# Patient Record
Sex: Female | Born: 1965 | Race: White | Hispanic: No | Marital: Married | Smoking: Never smoker
Health system: Southern US, Community
[De-identification: ages and names within clinical notes are randomized; demographics above are authoritative.]

## PROBLEM LIST (undated history)

## (undated) HISTORY — PX: BREAST BIOPSY: SHX20

---

## 2013-10-02 ENCOUNTER — Emergency Department (HOSPITAL_COMMUNITY)
Admission: EM | Admit: 2013-10-02 | Discharge: 2013-10-03 | Disposition: A | Discharge status: Home / Self Care | Payer: BC Managed Care – PPO | Attending: Emergency Medicine | Admitting: Emergency Medicine

## 2013-10-02 ENCOUNTER — Encounter (HOSPITAL_COMMUNITY): Payer: Self-pay | Admitting: Emergency Medicine

## 2013-10-02 DIAGNOSIS — W5911XA Bitten by nonvenomous snake, initial encounter: Secondary | ICD-10-CM | POA: Insufficient documentation

## 2013-10-02 DIAGNOSIS — Y9389 Activity, other specified: Secondary | ICD-10-CM | POA: Diagnosis not present

## 2013-10-02 DIAGNOSIS — W5901XA Bitten by nonvenomous lizards, initial encounter: Secondary | ICD-10-CM | POA: Insufficient documentation

## 2013-10-02 DIAGNOSIS — T63001A Toxic effect of unspecified snake venom, accidental (unintentional), initial encounter: Secondary | ICD-10-CM

## 2013-10-02 DIAGNOSIS — S61209A Unspecified open wound of unspecified finger without damage to nail, initial encounter: Secondary | ICD-10-CM | POA: Diagnosis not present

## 2013-10-02 DIAGNOSIS — Y929 Unspecified place or not applicable: Secondary | ICD-10-CM | POA: Diagnosis not present

## 2013-10-02 DIAGNOSIS — Z23 Encounter for immunization: Secondary | ICD-10-CM | POA: Insufficient documentation

## 2013-10-02 LAB — CBC WITH DIFFERENTIAL/PLATELET
Basophils Absolute: 0.1 10*3/uL (ref 0.0–0.1)
Basophils Relative: 1 % (ref 0–1)
EOS ABS: 0.1 10*3/uL (ref 0.0–0.7)
Eosinophils Relative: 1 % (ref 0–5)
HCT: 39.8 % (ref 36.0–46.0)
HEMOGLOBIN: 13.4 g/dL (ref 12.0–15.0)
LYMPHS ABS: 1.8 10*3/uL (ref 0.7–4.0)
Lymphocytes Relative: 28 % (ref 12–46)
MCH: 30.3 pg (ref 26.0–34.0)
MCHC: 33.7 g/dL (ref 30.0–36.0)
MCV: 90 fL (ref 78.0–100.0)
MONOS PCT: 5 % (ref 3–12)
Monocytes Absolute: 0.4 10*3/uL (ref 0.1–1.0)
Neutro Abs: 4.4 10*3/uL (ref 1.7–7.7)
Neutrophils Relative %: 65 % (ref 43–77)
Platelets: 202 10*3/uL (ref 150–400)
RBC: 4.42 MIL/uL (ref 3.87–5.11)
RDW: 12.4 % (ref 11.5–15.5)
WBC: 6.6 10*3/uL (ref 4.0–10.5)

## 2013-10-02 MED ORDER — HYDROCODONE-ACETAMINOPHEN 5-325 MG PO TABS
1.0000 | ORAL_TABLET | Freq: Once | ORAL | Status: AC
  Administered 2013-10-02: 1 via ORAL
  Filled 2013-10-02: qty 1

## 2013-10-02 MED ORDER — TETANUS-DIPHTH-ACELL PERTUSSIS 5-2.5-18.5 LF-MCG/0.5 IM SUSP
0.5000 mL | Freq: Once | INTRAMUSCULAR | Status: AC
  Administered 2013-10-03: 0.5 mL via INTRAMUSCULAR
  Filled 2013-10-02: qty 0.5

## 2013-10-02 NOTE — ED Provider Notes (Signed)
CSN: [id number]     Arrival date & time 10/02/13  2113 History   First MD Initiated Contact with Patient 10/02/13 2301     Chief Complaint  Patient presents with  . Snake Bite     (Consider location/radiation/quality/duration/timing/severity/associated sxs/prior Treatment) HPI Patient states she was trimming a bush around 7:30 this evening and felt a sharp pain to her right thumb. She notes to finger marks. She had immediate swelling and redness to the area. Just had considerable pain especially with palpation and movement of the right thumb. She denies any nausea or vomiting. No fevers or chills. Since that time the redness and swelling have decreased. She continues to have pain at the area of the finger marks into the thenar eminence. At no time did she see the animal. History reviewed. No pertinent past medical history. History reviewed. No pertinent past surgical history. No family history on file. History  Substance Use Topics  . Smoking status: Never Smoker   . Smokeless tobacco: Not on file  . Alcohol Use: Yes     Comment: soc.   OB History   Grav Para Term Preterm Abortions TAB SAB Ect Mult Living            0     Review of Systems  Constitutional: Negative for fever and chills.  Respiratory: Negative for shortness of breath.   Cardiovascular: Negative for chest pain.  Gastrointestinal: Negative for nausea, vomiting and abdominal pain.  Skin: Positive for color change and wound.  Neurological: Negative for dizziness and weakness.  All other systems reviewed and are negative.     Allergies  Review of patient's allergies indicates no known allergies.  Home Medications   Prior to Admission medications   Medication Sig Start Date End Date Taking? Authorizing Provider  acetaminophen (TYLENOL) 500 MG tablet Take 500 mg by mouth every 6 (six) hours as needed for mild pain.   Yes Historical Provider, MD   BP 112/44  Pulse 61  Temp(Src) 99.1 F (37.3 C) (Oral)  Resp  16  Ht  (1.651 m)  Wt 125 lb (56.7 kg)  BMI 20.80 kg/m2  SpO2 100%  LMP 09/28/2013 Physical Exam  Nursing note and vitals reviewed. Constitutional: She is oriented to person, place, and time. She appears well-developed and well-nourished. No distress.  HENT:  Head: Normocephalic and atraumatic.  Mouth/Throat: Oropharynx is clear and moist.  Eyes: EOM are normal. Pupils are equal, round, and reactive to light.  Neck: Normal range of motion. Neck supple.  Cardiovascular: Normal rate and regular rhythm.   Pulmonary/Chest: Effort normal and breath sounds normal. No respiratory distress. She has no wheezes. She has no rales.  Abdominal: Soft. Bowel sounds are normal. She exhibits no distension and no mass. There is no tenderness. There is no rebound and no guarding.  Musculoskeletal: Normal range of motion. She exhibits no edema and no tenderness.  Appears to be 2 small puncture wounds to the dorsal base of the right thumb. There is tenderness to palpation in the surrounding area and thenar eminence. There is pain with range of motion of the right thumb. I do not appreciate any erythema or swelling. Mildly decreased Refill.   Neurological: She is alert and oriented to person, place, and time.  Good grip strength bilaterally. Patient has mildly decreased sensation to the tips of the right fingers compared to left.  Skin: Skin is warm and dry. No rash noted. No erythema.  Psychiatric: She has a normal mood  and affect. Her behavior is normal.    ED Course  Procedures (including critical care time) Labs Review Labs Reviewed  CBC WITH DIFFERENTIAL  COMPREHENSIVE METABOLIC PANEL  PROTIME-INR  APTT  FIBRINOGEN    Imaging Review No results found.   EKG Interpretation None      MDM   Final diagnoses:  None    Discussed with poison control. Recommended observation for several hours. No recommendations for [employer]. Tetanus updated and pain medication given.  Patient observed  in emergency department for 4 hours. Her pain is improved. There is no redness or swelling. Will be discharged home with strict return precautions.  Loren Racer, MD 10/03/13 [phone]

## 2013-10-02 NOTE — ED Notes (Signed)
PT's hand is mildly swollen; no reddness noted; pt reports stiffness in thumb joint and shooting pain approximately every 10 mins. Small puncture mark visible at this time. Pt presents with picture 30 mins post bite. Hand had 2 visible bites and reddened. Pt has been icing. Symptoms improving; no other present symptoms.

## 2013-10-02 NOTE — ED Notes (Signed)
MD at bedside. 

## 2013-10-02 NOTE — ED Notes (Signed)
Pt states she was gardening at 730 pm tonight. States she reached into bush, and felt "a knife" go through left hand. Pt has two bite marks, appears to be bitten by snake. Pt has redness and welts to area and is reporting numbness to left thumb. Denies SOB. Pt AO x4. NAD.

## 2013-10-03 LAB — FIBRINOGEN: Fibrinogen: 247 mg/dL (ref 204–475)

## 2013-10-03 LAB — COMPREHENSIVE METABOLIC PANEL
ALK PHOS: 55 U/L (ref 39–117)
ALT: 25 U/L (ref 0–35)
AST: 25 U/L (ref 0–37)
Albumin: 3.8 g/dL (ref 3.5–5.2)
Anion gap: 13 (ref 5–15)
BUN: 23 mg/dL (ref 6–23)
CO2: 27 meq/L (ref 19–32)
Calcium: 9.2 mg/dL (ref 8.4–10.5)
Chloride: 103 mEq/L (ref 96–112)
Creatinine, Ser: 1.03 mg/dL (ref 0.50–1.10)
GFR calc non Af Amer: 63 mL/min — ABNORMAL LOW (ref >90)
GFR, EST AFRICAN AMERICAN: 73 mL/min — AB (ref >90)
Glucose, Bld: 104 mg/dL — ABNORMAL HIGH (ref 70–99)
POTASSIUM: 4.2 meq/L (ref 3.7–5.3)
SODIUM: 143 meq/L (ref 137–147)
TOTAL PROTEIN: 7 g/dL (ref 6.0–8.3)
Total Bilirubin: 1 mg/dL (ref 0.3–1.2)

## 2013-10-03 LAB — PROTIME-INR
INR: 1.14 (ref 0.00–1.49)
Prothrombin Time: 14.6 seconds (ref 11.6–15.2)

## 2013-10-03 LAB — APTT: aPTT: 34 seconds (ref 24–37)

## 2013-10-03 MED ORDER — HYDROCODONE-ACETAMINOPHEN 5-325 MG PO TABS: 1.0000 | ORAL_TABLET | ORAL | Status: AC | PRN

## 2013-10-03 NOTE — Discharge Instructions (Signed)
Snake Bite °Snakes may be either venomous (containing poison) or nonvenomous (nonpoisonous). A nonvenomous snake bite will cause trauma or a wound to the skin and possibly the deeper tissues. A venomous snake will also cause a traumatic wound, but more importantly, it may have injected venom into the wound. Snake bite venom can be extremely serious and even deadly. One type of venom may cause major skin, tissue and muscle damage, and failure of normal blood clotting. This may cause extreme swelling and pain of the affected area. Another type of venom can affect the brain and nervous system and may cause death. The treatment for venomous snake bite may require the use of antivenom medicine. If you are unsure if your bite is from a venomous snake, you MUST seek immediate medical attention. °YOU MIGHT NEED A TETANUS SHOT NOW IF: °· You have no idea when you had the last one. °· You have never had a tetanus shot before. °· The bite broke your skin. °If you need a tetanus shot, and you decide not to get one, there is a rare chance of getting tetanus. Sickness from tetanus can be serious. °HOME CARE INSTRUCTIONS  °A snake bit you and caused a skin wound. It may or may not have been venomous. If the snake was venomous, a small amount of venom may have been injected into your skin. °· Keep the bite area clean and dry. °· Keep the extremity elevated above the level of the heart for the next 48 hours. °· Wash the bite area 3 times daily with soap and water or an antiseptic. Apply an adhesive or gauze bandage to the bite area. °· If you develop blistering of any type at the site of the bite, protect the blisters from breaking. Do not attempt to open it. °· If you were given a tetanus shot, your arm may get swollen, red and warm at the shot site. This is a common response to the injection. °SEEK IMMEDIATE MEDICAL CARE IF:  °· You develop symptoms of poisoning including increased pain, redness, swelling, blood blisters or purple  spots in the bite area, nausea, vomiting, numbness, tingling, excessive sweating, breathing difficulty, blurred vision, feelings of lightheadedness, or feeling faint. If you develop symptoms of poisoning, you MUST seek immediate medical attention. °· The bite becomes infected. Symptoms may include redness, swelling, pain, tenderness, pus, red streaks running from the wound, or an oral temperature above 102° F (38.9° C), not controlled by medicine. °· Your condition or wound becomes worse. °MAKE SURE YOU:  °· Understand these instructions. °· Will watch your condition. °· Will get help right away if you are not doing well or get worse. °Document Released: 12/31/1999 Document Revised: 03/27/2011 Document Reviewed: 05/26/2009 °[employer]® Patient Information ©2015 [employer]. This information is not intended to replace advice given to you by your health care provider. Make sure you discuss any questions you have with your health care provider. ° °

## 2013-10-03 NOTE — ED Notes (Signed)
Dr Yelverton at bedside.  

## 2014-07-21 ENCOUNTER — Other Ambulatory Visit: Payer: Self-pay

## 2014-07-21 DIAGNOSIS — Z1231 Encounter for screening mammogram for malignant neoplasm of breast: Secondary | ICD-10-CM

## 2014-08-19 ENCOUNTER — Ambulatory Visit
Admission: RE | Admit: 2014-08-19 | Discharge: 2014-08-19 | Disposition: A | Discharge status: Home / Self Care | Payer: 59 | Source: Ambulatory Visit

## 2014-08-19 DIAGNOSIS — Z1231 Encounter for screening mammogram for malignant neoplasm of breast: Secondary | ICD-10-CM

## 2015-08-26 ENCOUNTER — Ambulatory Visit (HOSPITAL_COMMUNITY)
Admission: EM | Admit: 2015-08-26 | Discharge: 2015-08-26 | Disposition: A | Discharge status: Home / Self Care | Payer: 59 | Attending: Family Medicine | Admitting: Family Medicine

## 2015-08-26 ENCOUNTER — Encounter (HOSPITAL_COMMUNITY): Payer: Self-pay | Admitting: Emergency Medicine

## 2015-08-26 DIAGNOSIS — M545 Low back pain, unspecified: Secondary | ICD-10-CM

## 2015-08-26 MED ORDER — KETOROLAC TROMETHAMINE 60 MG/2ML IM SOLN
60.0000 mg | Freq: Once | INTRAMUSCULAR | Status: AC
  Administered 2015-08-26: 60 mg via INTRAMUSCULAR

## 2015-08-26 MED ORDER — NAPROXEN 500 MG PO TABS: 500.0000 mg | ORAL_TABLET | Freq: Two times a day (BID) | ORAL | 0 refills | Status: AC

## 2015-08-26 MED ORDER — KETOROLAC TROMETHAMINE 60 MG/2ML IM SOLN
INTRAMUSCULAR | Status: AC
  Filled 2015-08-26: qty 2

## 2015-08-26 MED ORDER — CYCLOBENZAPRINE HCL 10 MG PO TABS: 10.0000 mg | ORAL_TABLET | Freq: Two times a day (BID) | ORAL | 0 refills | Status: AC | PRN

## 2015-08-26 NOTE — ED Triage Notes (Signed)
Pt c/o lower back pain onset this am after sneezing at work.   Reports hx of ruptured disc... Pain increases w/activity  Brought back in wheel chair.... A&O x4... NAD

## 2015-08-26 NOTE — ED Provider Notes (Signed)
CSN: [id number]     Arrival date & time 08/26/15  1130 History   None    Chief Complaint  Patient presents with  . Back Pain   (Consider location/radiation/quality/duration/timing/severity/associated sxs/prior Treatment) Patient states she was getting out of shower this morning and felt pain in her right back as she was drying off.  She was at work and coughed and felt a sharp pain or pulling in her right lumbar muscle and has been bent over and having difficulty standing up straight because of the pain in her back.     The history is provided by the patient.  Back Pain  Location:  Lumbar spine Quality:  Aching, cramping and stabbing Radiates to:  Does not radiate Pain severity:  Moderate Onset quality:  Sudden Duration:  3 hours Timing:  Constant Progression:  Waxing and waning Chronicity:  New Context: physical stress and twisting   Relieved by:  None tried Worsened by:  Nothing   History reviewed. No pertinent past medical history. History reviewed. No pertinent surgical history. History reviewed. No pertinent family history. Social History  Substance Use Topics  . Smoking status: Never Smoker  . Smokeless tobacco: Never Used  . Alcohol use Yes     Comment: soc.   OB History    Gravida Para Term Preterm AB Living             0   SAB TAB Ectopic Multiple Live Births                 Review of Systems  Constitutional: Negative.   HENT: Negative.   Eyes: Negative.   Respiratory: Negative.   Cardiovascular: Negative.   Gastrointestinal: Negative.   Endocrine: Negative.   Genitourinary: Negative.   Musculoskeletal: Positive for back pain.  Skin: Negative.   Allergic/Immunologic: Negative.   Neurological: Negative.   Hematological: Negative.   Psychiatric/Behavioral: Negative.     Allergies  Review of patient's allergies indicates no known allergies.  Home Medications   Prior to Admission medications   Medication Sig Start Date End Date Taking?  Authorizing Provider  acetaminophen (TYLENOL) 500 MG tablet Take 500 mg by mouth every 6 (six) hours as needed for mild pain.    Historical Provider, MD  cyclobenzaprine (FLEXERIL) 10 MG tablet Take 1 tablet (10 mg total) by mouth 2 (two) times daily as needed for muscle spasms. 08/26/15   Deatra CanterWilliam J Elgin Carn, FNP  HYDROcodone-acetaminophen (NORCO) 5-325 MG per tablet Take 1 tablet by mouth every 4 (four) hours as needed. 10/03/13   Loren Raceravid Yelverton, MD  naproxen (NAPROSYN) 500 MG tablet Take 1 tablet (500 mg total) by mouth 2 (two) times daily with a meal. 08/26/15   Deatra CanterWilliam J Neysha Criado, FNP   Meds Ordered and Administered this Visit   Medications  ketorolac (TORADOL) injection 60 mg (not administered)    BP 113/68 (BP Location: Left Arm)   Pulse 62   Temp 98.6 F (37 C) (Oral)   Resp 16   LMP 08/05/2015   SpO2 99%  No data found.   Physical Exam  Constitutional: She appears well-developed and well-nourished.  HENT:  Head: Normocephalic and atraumatic.  Eyes: Conjunctivae are normal. Pupils are equal, round, and reactive to light.  Neck: Normal range of motion. Neck supple.  Cardiovascular: Normal rate, regular rhythm and normal heart sounds.   Pulmonary/Chest: Effort normal and breath sounds normal.  Abdominal: Soft. Bowel sounds are normal.  Musculoskeletal: She exhibits tenderness.  TTP right lumbar paraspinous muscles  and neg SLR bilateral  Nursing note and vitals reviewed.   Urgent Care Course   Clinical Course    Procedures (including critical care time)  Labs Review Labs Reviewed - No data to display  Imaging Review No results found.   Visual Acuity Review  Right Eye Distance:   Left Eye Distance:   Bilateral Distance:    Right Eye Near:   Left Eye Near:    Bilateral Near:         MDM   1. Right-sided low back pain without sciatica    Toradol  IM Naprosyn  one po bid x 10 days #20 Cyclobenzaprine 10 mg one po bid prn #20     Deatra Canter, FNP 08/26/15 1309

## 2015-10-27 ENCOUNTER — Other Ambulatory Visit: Payer: Self-pay | Admitting: Family Medicine

## 2015-10-27 DIAGNOSIS — Z1231 Encounter for screening mammogram for malignant neoplasm of breast: Secondary | ICD-10-CM

## 2016-04-05 DIAGNOSIS — M5126 Other intervertebral disc displacement, lumbar region: Secondary | ICD-10-CM | POA: Diagnosis not present

## 2016-04-05 DIAGNOSIS — M5416 Radiculopathy, lumbar region: Secondary | ICD-10-CM | POA: Diagnosis not present

## 2016-04-05 DIAGNOSIS — M5136 Other intervertebral disc degeneration, lumbar region: Secondary | ICD-10-CM | POA: Diagnosis not present

## 2016-05-03 DIAGNOSIS — N841 Polyp of cervix uteri: Secondary | ICD-10-CM | POA: Diagnosis not present

## 2016-05-04 DIAGNOSIS — Z6820 Body mass index (BMI) 20.0-20.9, adult: Secondary | ICD-10-CM | POA: Diagnosis not present

## 2016-05-04 DIAGNOSIS — Z01419 Encounter for gynecological examination (general) (routine) without abnormal findings: Secondary | ICD-10-CM | POA: Diagnosis not present

## 2016-08-25 ENCOUNTER — Other Ambulatory Visit: Payer: Self-pay | Admitting: Obstetrics and Gynecology

## 2016-08-25 DIAGNOSIS — Z1231 Encounter for screening mammogram for malignant neoplasm of breast: Secondary | ICD-10-CM

## 2016-09-05 ENCOUNTER — Ambulatory Visit
Admission: RE | Admit: 2016-09-05 | Discharge: 2016-09-05 | Disposition: A | Discharge status: Home / Self Care | Payer: 59 | Source: Ambulatory Visit | Attending: Obstetrics and Gynecology | Admitting: Obstetrics and Gynecology

## 2016-09-05 DIAGNOSIS — Z1231 Encounter for screening mammogram for malignant neoplasm of breast: Secondary | ICD-10-CM | POA: Diagnosis not present

## 2017-05-21 DIAGNOSIS — Z01419 Encounter for gynecological examination (general) (routine) without abnormal findings: Secondary | ICD-10-CM | POA: Diagnosis not present

## 2017-05-21 DIAGNOSIS — Z808 Family history of malignant neoplasm of other organs or systems: Secondary | ICD-10-CM | POA: Diagnosis not present

## 2017-05-21 DIAGNOSIS — Z800 Family history of malignant neoplasm of digestive organs: Secondary | ICD-10-CM | POA: Diagnosis not present

## 2017-05-21 DIAGNOSIS — Z8042 Family history of malignant neoplasm of prostate: Secondary | ICD-10-CM | POA: Diagnosis not present

## 2017-05-21 DIAGNOSIS — Z6820 Body mass index (BMI) 20.0-20.9, adult: Secondary | ICD-10-CM | POA: Diagnosis not present

## 2017-07-20 DIAGNOSIS — Z809 Family history of malignant neoplasm, unspecified: Secondary | ICD-10-CM | POA: Diagnosis not present

## 2019-06-04 IMAGING — MG 2D DIGITAL SCREENING BILATERAL MAMMOGRAM WITH CAD AND ADJUNCT TO
9 of 12 series · 9 of 28 positions shown · non-contrast
Comparison: Previous exam(s).

CLINICAL DATA: Screening.

EXAM:
2D DIGITAL SCREENING BILATERAL MAMMOGRAM WITH CAD AND ADJUNCT TOMO

[L CC synth-2D]
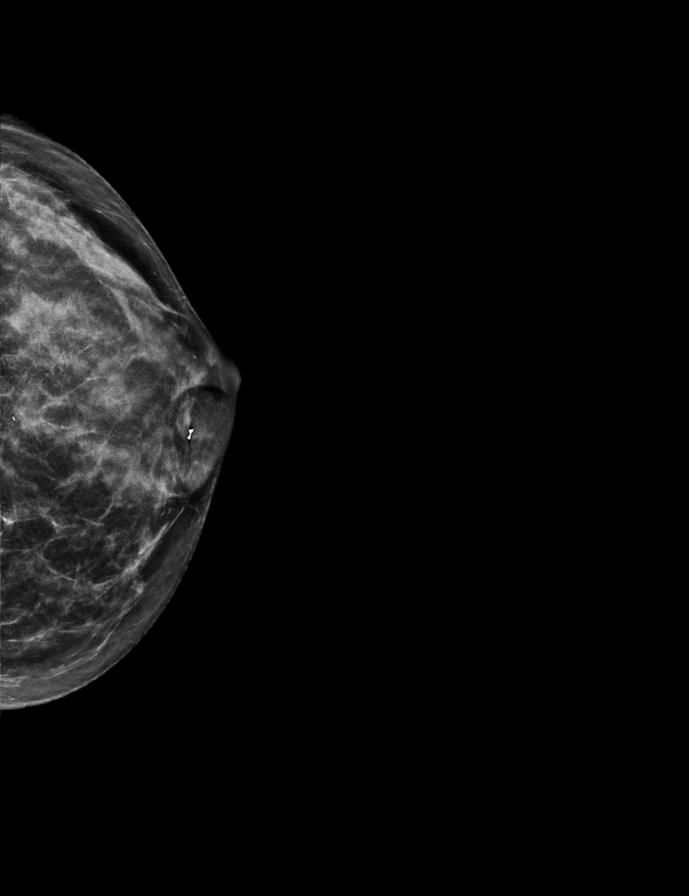

[L CC]
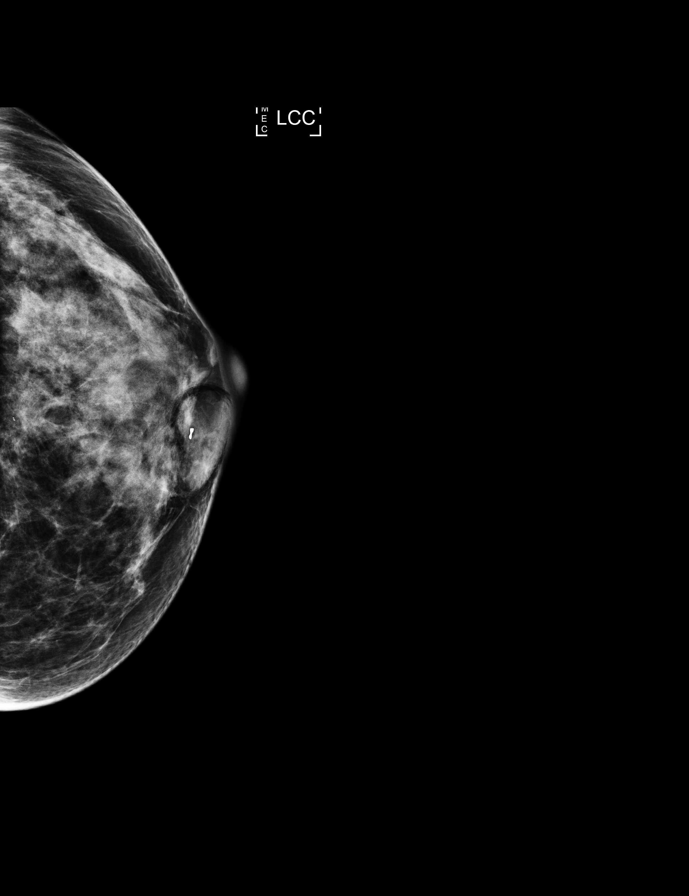

[L MLO]
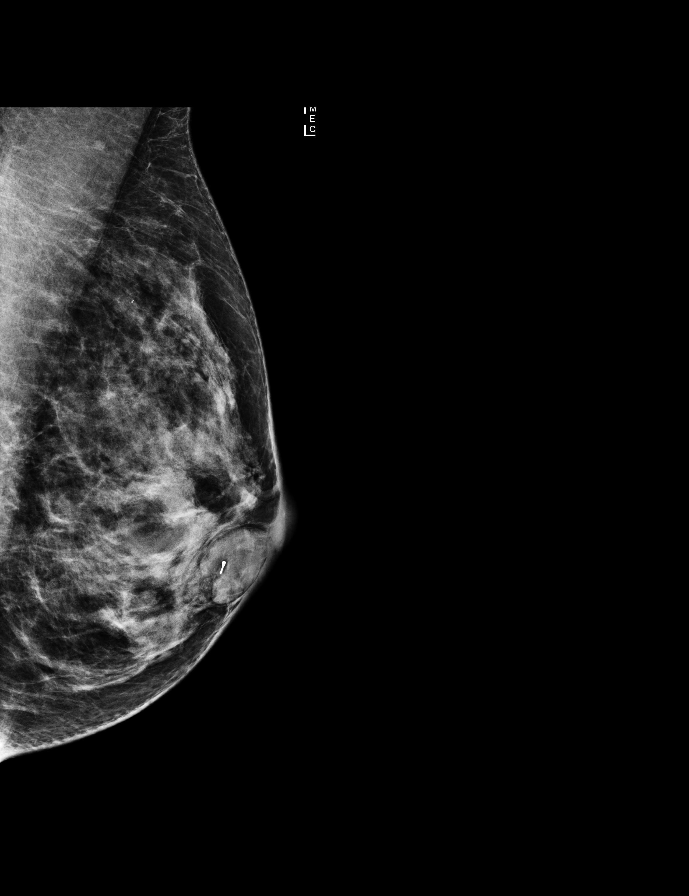

[L MLO synth-2D]
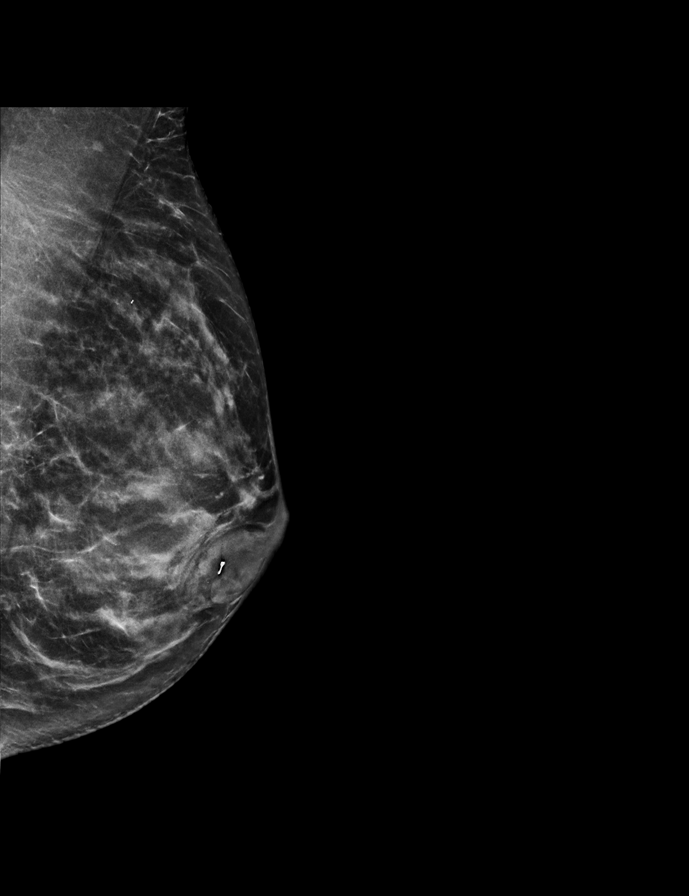

[R CC synth-2D]
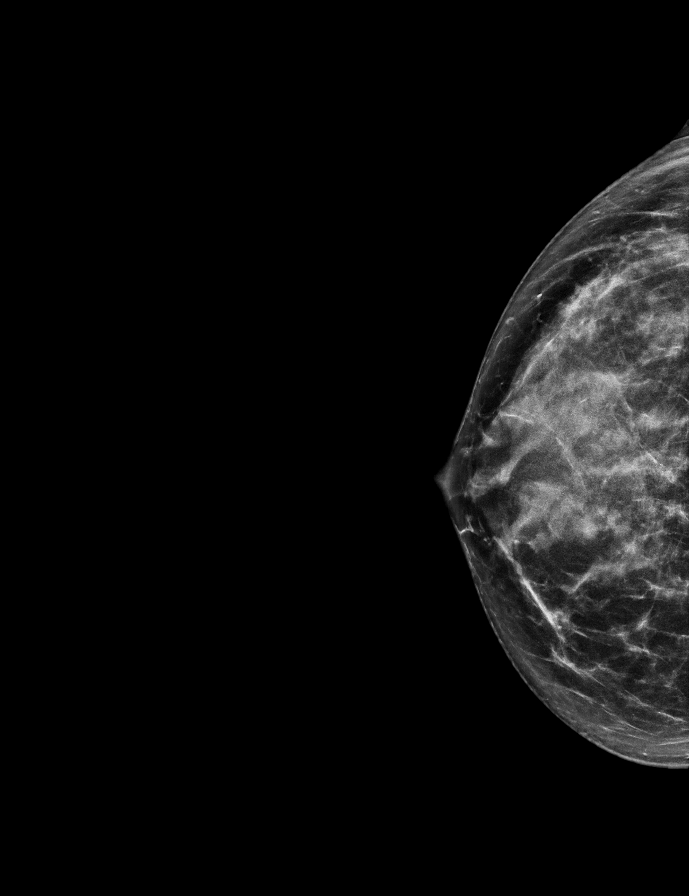

[R CC]
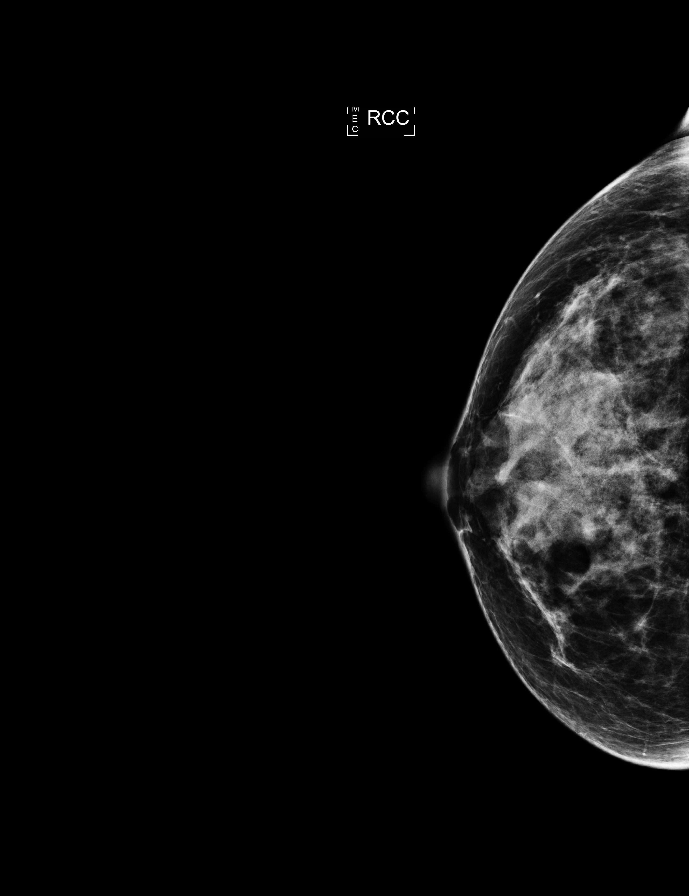

[R MLO]
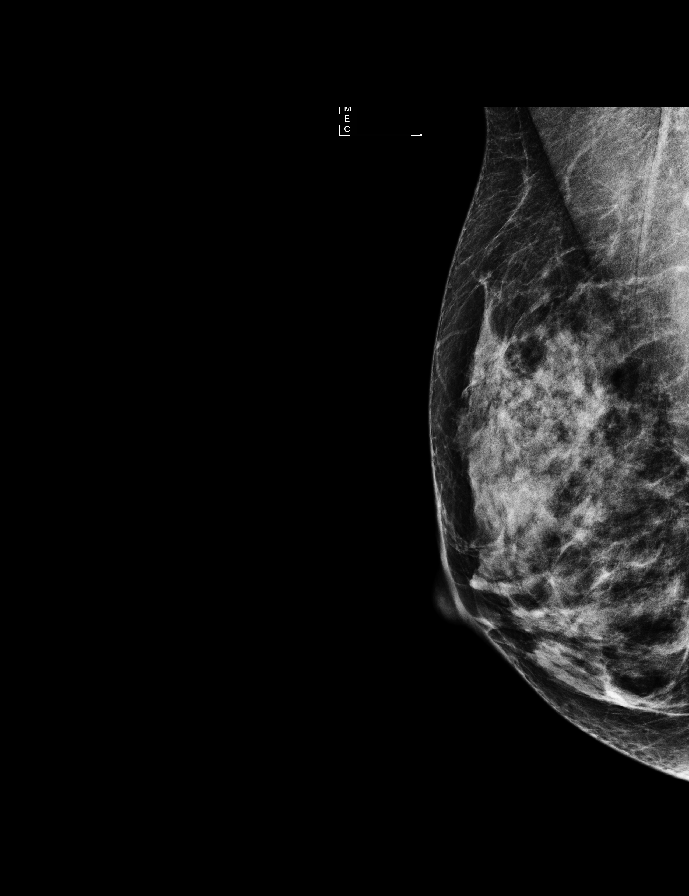

[R MLO synth-2D]
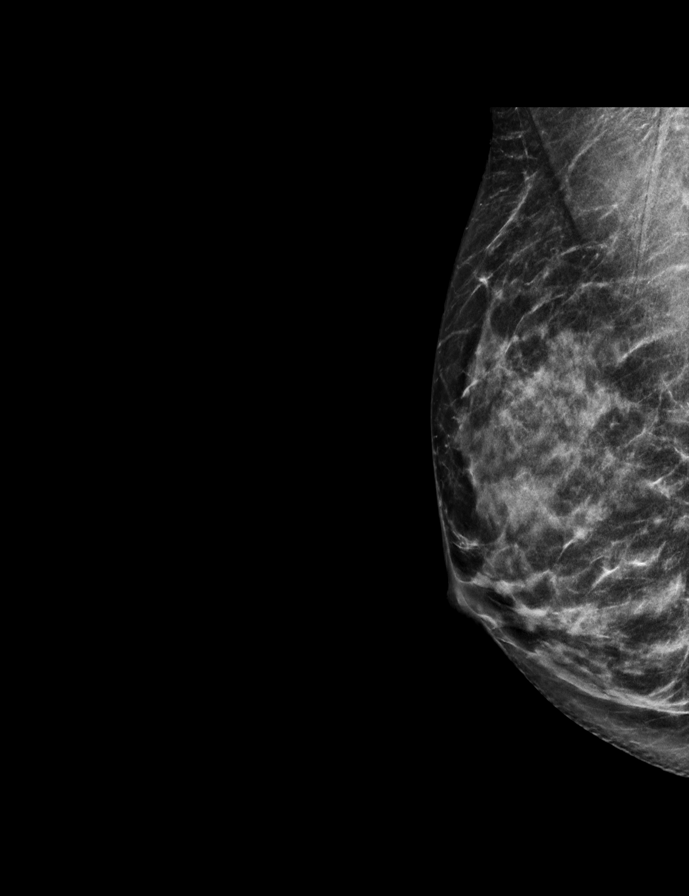

[L CC tomo · tomo slice 26/51.0]
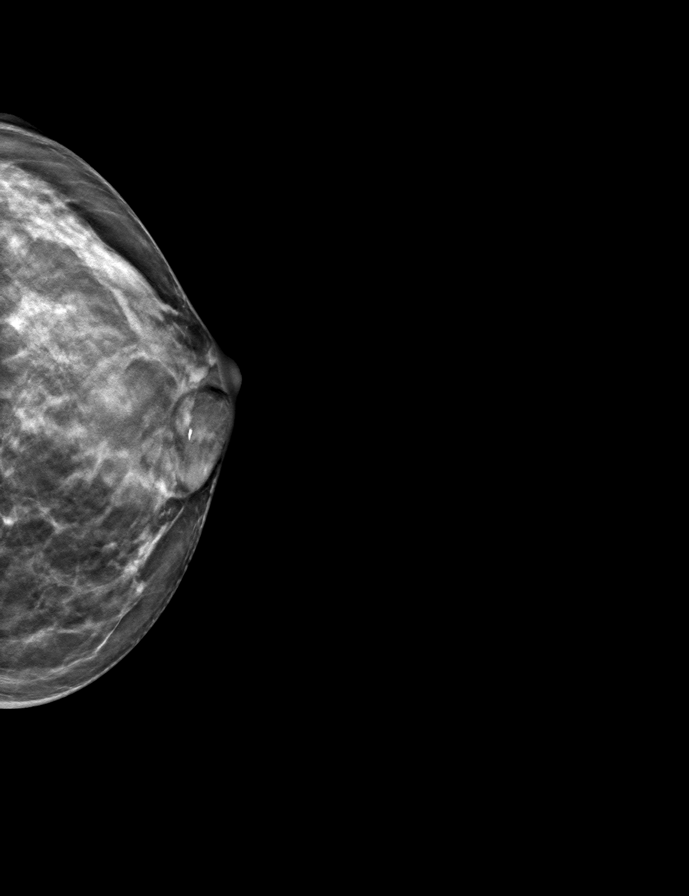

[9 of 28 positions shown; findings below may reference images not displayed]

ACR Breast Density Category c: The breast tissue is heterogeneously
dense, which may obscure small masses.
FINDINGS: There are no findings suspicious for malignancy. Images were
processed with CAD.
IMPRESSION: No mammographic evidence of malignancy. A result letter of this
screening mammogram will be mailed directly to the patient.

RECOMMENDATION:
Screening mammogram in one year. (Code:TN-[id number])

BI-RADS CATEGORY  1: Negative.

## 2021-12-02 ENCOUNTER — Other Ambulatory Visit: Payer: Self-pay | Admitting: Obstetrics and Gynecology

## 2021-12-02 ENCOUNTER — Encounter: Payer: Self-pay | Admitting: Obstetrics and Gynecology

## 2021-12-02 DIAGNOSIS — N6320 Unspecified lump in the left breast, unspecified quadrant: Secondary | ICD-10-CM

## 2021-12-21 ENCOUNTER — Ambulatory Visit
Admission: RE | Admit: 2021-12-21 | Discharge: 2021-12-21 | Disposition: A | Discharge status: Home / Self Care | Payer: 59 | Source: Ambulatory Visit | Attending: Obstetrics and Gynecology | Admitting: Obstetrics and Gynecology

## 2021-12-21 DIAGNOSIS — N6320 Unspecified lump in the left breast, unspecified quadrant: Secondary | ICD-10-CM
